# Patient Record
Sex: Female | Born: 1945 | Race: Black or African American | Hispanic: No | Marital: Single | Smoking: Current every day smoker
Health system: Southern US, Community
[De-identification: ages and names within clinical notes are randomized; demographics above are authoritative.]

## PROBLEM LIST (undated history)

## (undated) DIAGNOSIS — IMO0002 Reserved for concepts with insufficient information to code with codable children: Secondary | ICD-10-CM

## (undated) DIAGNOSIS — I10 Essential (primary) hypertension: Secondary | ICD-10-CM

## (undated) DIAGNOSIS — E1165 Type 2 diabetes mellitus with hyperglycemia: Secondary | ICD-10-CM

## (undated) DIAGNOSIS — E785 Hyperlipidemia, unspecified: Secondary | ICD-10-CM

## (undated) HISTORY — PX: TONSILECTOMY, ADENOIDECTOMY, BILATERAL MYRINGOTOMY AND TUBES: SHX2538

## (undated) HISTORY — PX: ABDOMINAL HYSTERECTOMY: SUR658

---

## 2011-11-25 ENCOUNTER — Other Ambulatory Visit: Payer: Self-pay | Admitting: Cardiovascular Disease

## 2011-12-22 ENCOUNTER — Ambulatory Visit (HOSPITAL_COMMUNITY)
Admission: RE | Admit: 2011-12-22 | Discharge: 2011-12-22 | Disposition: A | Discharge status: Home / Self Care | Payer: 59 | Source: Ambulatory Visit | Attending: Cardiovascular Disease | Admitting: Cardiovascular Disease

## 2011-12-22 ENCOUNTER — Encounter (HOSPITAL_COMMUNITY): Payer: Self-pay | Admitting: Pharmacy Technician

## 2011-12-22 ENCOUNTER — Encounter (HOSPITAL_COMMUNITY): Payer: Self-pay | Admitting: Cardiology

## 2011-12-22 ENCOUNTER — Encounter (HOSPITAL_COMMUNITY)
Admission: RE | Disposition: A | Discharge status: Home / Self Care | Payer: Self-pay | Source: Ambulatory Visit | Attending: Cardiovascular Disease

## 2011-12-22 DIAGNOSIS — I2510 Atherosclerotic heart disease of native coronary artery without angina pectoris: Secondary | ICD-10-CM | POA: Insufficient documentation

## 2011-12-22 DIAGNOSIS — I2582 Chronic total occlusion of coronary artery: Secondary | ICD-10-CM | POA: Insufficient documentation

## 2011-12-22 DIAGNOSIS — E1165 Type 2 diabetes mellitus with hyperglycemia: Secondary | ICD-10-CM | POA: Diagnosis present

## 2011-12-22 DIAGNOSIS — E119 Type 2 diabetes mellitus without complications: Secondary | ICD-10-CM | POA: Insufficient documentation

## 2011-12-22 DIAGNOSIS — Z8249 Family history of ischemic heart disease and other diseases of the circulatory system: Secondary | ICD-10-CM | POA: Insufficient documentation

## 2011-12-22 DIAGNOSIS — E785 Hyperlipidemia, unspecified: Secondary | ICD-10-CM | POA: Insufficient documentation

## 2011-12-22 DIAGNOSIS — F17200 Nicotine dependence, unspecified, uncomplicated: Secondary | ICD-10-CM | POA: Insufficient documentation

## 2011-12-22 DIAGNOSIS — I10 Essential (primary) hypertension: Secondary | ICD-10-CM | POA: Insufficient documentation

## 2011-12-22 DIAGNOSIS — R9430 Abnormal result of cardiovascular function study, unspecified: Secondary | ICD-10-CM | POA: Diagnosis present

## 2011-12-22 DIAGNOSIS — R9439 Abnormal result of other cardiovascular function study: Secondary | ICD-10-CM | POA: Insufficient documentation

## 2011-12-22 HISTORY — DX: Reserved for concepts with insufficient information to code with codable children: IMO0002

## 2011-12-22 HISTORY — DX: Hyperlipidemia, unspecified: E78.5

## 2011-12-22 HISTORY — DX: Type 2 diabetes mellitus with hyperglycemia: E11.65

## 2011-12-22 HISTORY — PX: LEFT HEART CATHETERIZATION WITH CORONARY ANGIOGRAM: SHX5451

## 2011-12-22 HISTORY — DX: Essential (primary) hypertension: I10

## 2011-12-22 LAB — CBC
MCH: 28.2 pg (ref 26.0–34.0)
MCHC: 33.9 g/dL (ref 30.0–36.0)
MCV: 83.3 fL (ref 78.0–100.0)
Platelets: 286 10*3/uL (ref 150–400)
RBC: 4.32 MIL/uL (ref 3.87–5.11)

## 2011-12-22 LAB — BASIC METABOLIC PANEL
BUN: 14 mg/dL (ref 6–23)
CO2: 28 mEq/L (ref 19–32)
Calcium: 9.9 mg/dL (ref 8.4–10.5)
GFR calc non Af Amer: 73 mL/min — ABNORMAL LOW (ref >90)
Glucose, Bld: 329 mg/dL — ABNORMAL HIGH (ref 70–99)
Sodium: 138 mEq/L (ref 135–145)

## 2011-12-22 LAB — GLUCOSE, CAPILLARY: Glucose-Capillary: 263 mg/dL — ABNORMAL HIGH (ref 70–99)

## 2011-12-22 SURGERY — LEFT HEART CATHETERIZATION WITH CORONARY ANGIOGRAM
Anesthesia: LOCAL

## 2011-12-22 MED ORDER — NITROGLYCERIN 0.2 MG/ML ON CALL CATH LAB
INTRAVENOUS | Status: AC
  Filled 2011-12-22: qty 1

## 2011-12-22 MED ORDER — HEPARIN (PORCINE) IN NACL 2-0.9 UNIT/ML-% IJ SOLN
INTRAMUSCULAR | Status: AC
  Filled 2011-12-22: qty 1000

## 2011-12-22 MED ORDER — SODIUM CHLORIDE 0.9 % IV SOLN
INTRAVENOUS | Status: DC
  Administered 2011-12-22: 12:00:00 via INTRAVENOUS

## 2011-12-22 MED ORDER — LIDOCAINE HCL (PF) 1 % IJ SOLN
INTRAMUSCULAR | Status: AC
  Filled 2011-12-22: qty 30

## 2011-12-22 MED ORDER — DIAZEPAM 5 MG PO TABS
5.0000 mg | ORAL_TABLET | ORAL | Status: AC
  Administered 2011-12-22: 5 mg via ORAL
  Filled 2011-12-22: qty 1

## 2011-12-22 MED ORDER — SODIUM CHLORIDE 0.9 % IJ SOLN: 3.0000 mL | INTRAMUSCULAR | Status: DC | PRN

## 2011-12-22 MED ORDER — HEPARIN (PORCINE) IN NACL 2-0.9 UNIT/ML-% IJ SOLN
INTRAMUSCULAR | Status: AC
  Filled 2011-12-22: qty 500

## 2011-12-22 NOTE — H&P (Signed)
  H & P will be scanned in.  Pt was reexamined and existing H & P reviewed. No changes found.  [name], MD [facility] 12/22/2011 1:16 PM

## 2011-12-22 NOTE — Op Note (Signed)
Dorothy [facility] is a 66 y.o. female    [id number] LOCATION:  FACILITY: [facility]  PHYSICIAN: Nanetta Batty, M.D. 1945-12-23   DATE OF PROCEDURE:  12/22/2011  DATE OF DISCHARGE:  [facility]  CARDIAC CATHETERIZATION     History obtained from chart review.Pleasant 79 y/o single mother with one son. She has worked at [employer] for 81yrs. No history of chest pain but she had an abnormal Myoview a year ago. She has multiple cardiac risk factors including DM, HTN, smoking, Dyslipidemia, and a family history. Her GXT Myoview was recently repeated and revealed moderate ischemia. She is admitted now for diagnostic cath. Echo in Nov 2013- NL LVF.    PROCEDURE DESCRIPTION:    The patient was brought to the second floor  [facility] lab in the postabsorptive state. She was premedicated with Valium 5 mg by mouth. Her right groinwas prepped and shaved in usual sterile fashion. Xylocaine 1% was used for local anesthesia. A 5 French sheath was inserted into the right common femoral  artery using standard Seldinger technique. 5 French right and left Judkins diagnostic catheters over 5 and pigtail catheter were used for selective coronary angiography and left ventriculography respectively. A subselective view of the left internal mammary artery was also patent. Visipaque dye was used for the entirety of the case. Retrograde aortic, left ventricular, and pullback pressures were recorded.   HEMODYNAMICS:    AO SYSTOLIC/AO DIASTOLIC: 156/73   LV SYSTOLIC/LV DIASTOLIC: 153/9  ANGIOGRAPHIC RESULTS:   1. Left main; normal  2. LAD; moderately calcified fluoroscopically in the proximal segment with minimal irregularities. 3. Left circumflex; nondominant occlusion in the midportion and left to left collaterals of the distal PLA branches.  4. Right coronary artery; dominant with a 60% stenosis on the first bend , 80% stenosis at the genue, total PLA just after the PDA  takeoff. The PLA filled by left-to-right collaterals. 5.LIMA was subselectively visualized and widely patent. It was suitable for use during coronary artery bypass grafting if necessary 6. 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 Her LAD has minor irregularities. She is completely asymptomatic. At this point I think continued medical therapy is warranted. I do not think she needs percutaneous or surgical revascularization. In general of the right common femoral artery demonstrating suitability for closure and a "mixed" femoral closure device was used to obtain hemostasis. The patient left the Cath Lab in stable condition she will remain recumbent for 2 hours, will be gently hydrated and discharged home. She will followup with Arnette Felts PA-C at [facility] clinic who has been notified of these results.  [name] MD, [facility] 12/22/2011 1:54 PM

## 2011-12-22 NOTE — H&P (Signed)
Patient ID: Dorothy Watkins MRN: [id number], DOB/AGE: [birth year]   Admit date: 12/22/2011   Primary Physician: No primary provider on file. Primary Cardiologist: M. Duran PA  HPI:  Pleasant 66 y/o single mother with one son. She has worked at [employer] for 11yrs. No history of chest pain but she had an abnormal Myoview a year ago. She has multiple cardiac risk factors including DM, HTN, smoking, Dyslipidemia, and a family history. Her GXT Myoview was recently repeated and revealed moderate ischemia. She is admitted now for diagnostic cath. Echo in Nov 2013- NL LVF.   Problem List: Past Medical History  Diagnosis Date  . Diabetes mellitus type II, uncontrolled     HGb A1c 25 Oct 2011  . HTN (hypertension)   . Dyslipidemia     Past Surgical History  Procedure Date  . Abdominal hysterectomy   . Tonsilectomy, adenoidectomy, bilateral myringotomy and tubes      Allergies:  Allergies  Allergen Reactions  . Penicillins Swelling     Home Medications Prescriptions prior to admission  Medication Sig Dispense Refill  . aspirin EC 81 MG tablet Take 81 mg by mouth daily.      . Multiple Vitamin (MULTIVITAMIN WITH MINERALS) TABS Take 1 tablet by mouth daily.      Marland Kitchen olmesartan-hydrochlorothiazide (BENICAR HCT) 40-25 MG per tablet Take 1 tablet by mouth daily.      Marland Kitchen PRESCRIPTION MEDICATION Take 500 mg by mouth every morning. Metformin.      Marland Kitchen PRESCRIPTION MEDICATION Take 1 tablet by mouth daily. 3-in-1 combo diabetic medication.      Marland Kitchen PRESCRIPTION MEDICATION Take 1 tablet by mouth daily. Cholesterol combination medication.      . Vitamin D, Ergocalciferol, (DRISDOL) 50000 UNITS CAPS Take 50,000 Units by mouth every 7 (seven) days. Takes on Fridays.         Family History  Problem Relation Age of Onset  . Coronary artery disease Father 84     History   Social History  . Marital Status: Single    Spouse Name: N/A    Number of Children: N/A  . Years of Education: N/A    Occupational History  . Not on file.   Social History Main Topics  . Smoking status: Current Every Day Smoker -- 0.3 packs/day  . Smokeless tobacco: Not on file  . Alcohol Use: Not on file  . Drug Use: Not on file  . Sexually Active: Not on file   Other Topics Concern  . Not on file   Social History Narrative   Single mother, one son with grandchildren. Worked in [employer] at [employer] for 42yrs.     Review of Systems: General: negative for chills, fever, night sweats or weight changes.  Cardiovascular: negative for chest pain, dyspnea on exertion, edema, orthopnea, palpitations, paroxysmal nocturnal dyspnea or shortness of breath Dermatological: negative for rash Respiratory: negative for cough or wheezing Urologic: negative for hematuria, prior history of nephrolithiasis, no history of nephropathy Abdominal: negative for nausea, vomiting, diarrhea, bright red blood per rectum, melena, or hematemesis Neurologic: negative for visual changes, syncope, or dizziness, no history of neuropathy History of osteoporosis, chronic Lt ankle pain after remote injury. All other systems reviewed and are otherwise negative except as noted above.  Physical Exam: Blood pressure 137/74, pulse 69, temperature 98.6 F (37 C), temperature source Oral, resp. rate 18, height 5\' 10"  (1.778 m), weight 85.276 kg (188 lb), SpO2 97.00%.  General appearance: alert, cooperative and no distress Neck: no adenopathy, no  carotid bruit, no JVD, supple, symmetrical, trachea midline and thyroid not enlarged, symmetric, no tenderness/mass/nodules Lungs: clear to auscultation bilaterally Heart: regular rate and rhythm, S1, S2 normal, no murmur, click, rub or gallop Abdomen: soft, non-tender; bowel sounds normal; no masses,  no organomegaly Extremities: extremities normal, atraumatic, no cyanosis or edema Pulses: 2+ and symmetric Skin: Skin color, texture, turgor normal. No rashes or lesions Neurologic: Grossly  normal    Labs:   Results for orders placed during Dorothy hospital encounter of 12/22/11 (from Dorothy past 24 hour(s))  GLUCOSE, CAPILLARY     Status: Abnormal   Collection Time   12/22/11 11:22 AM      Component Value Range   Glucose-Capillary 291 (*) 70 - 99 mg/dL  BASIC METABOLIC PANEL     Status: Abnormal   Collection Time   12/22/11 11:23 AM      Component Value Range   Sodium 138  135 - 145 mEq/L   Potassium 3.7  3.5 - 5.1 mEq/L   Chloride 100  96 - 112 mEq/L   CO2 28  19 - 32 mEq/L   Glucose, Bld 329 (*) 70 - 99 mg/dL   BUN 14  6 - 23 mg/dL   Creatinine, Ser 1.61  0.50 - 1.10 mg/dL   Calcium 9.9  8.4 - 09.6 mg/dL   GFR calc non Af Amer 73 (*) >90 mL/min   GFR calc Af Amer 85 (*) >90 mL/min  CBC     Status: Normal   Collection Time   12/22/11 11:23 AM      Component Value Range   WBC 9.6  4.0 - 10.5 K/uL   RBC 4.32  3.87 - 5.11 MIL/uL   Hemoglobin 12.2  12.0 - 15.0 g/dL   HCT 04.5  40.9 - 81.1 %   MCV 83.3  78.0 - 100.0 fL   MCH 28.2  26.0 - 34.0 pg   MCHC 33.9  30.0 - 36.0 g/dL   RDW 91.4  78.2 - 95.6 %   Platelets 286  150 - 400 K/uL  PROTIME-INR     Status: Normal   Collection Time   12/22/11 11:23 AM      Component Value Range   Prothrombin Time 14.0  11.6 - 15.2 seconds   INR 1.09  0.00 - 1.49     Radiology/Studies: No results found.  OZH:[id number]  ASSESSMENT AND PLAN:  Principal Problem:  *Abnormal cardiovascular function study, 11/13- moderate ischemia Active Problems:  Diabetes mellitus type II, uncontrolled, HGb A1c 14 in October 2013  HTN (hypertension)  Dyslipidemia  Family history of coronary arteriosclerosis, F died in his late 34s  Plan- Cath today.  [name], PA-C 12/22/2011, 12:12 PM  Agree with note written by [name] PAC  + CRF, + myoview for cath today  [name] 12/22/2011 1:16 PM

## 2013-12-20 ENCOUNTER — Encounter (HOSPITAL_COMMUNITY): Payer: Self-pay | Admitting: Cardiovascular Disease

## 2014-12-08 ENCOUNTER — Encounter (HOSPITAL_BASED_OUTPATIENT_CLINIC_OR_DEPARTMENT_OTHER): Payer: Self-pay | Admitting: *Deleted

## 2014-12-08 ENCOUNTER — Emergency Department (HOSPITAL_BASED_OUTPATIENT_CLINIC_OR_DEPARTMENT_OTHER): Payer: Medicare HMO

## 2014-12-08 ENCOUNTER — Emergency Department (HOSPITAL_BASED_OUTPATIENT_CLINIC_OR_DEPARTMENT_OTHER)
Admission: EM | Admit: 2014-12-08 | Discharge: 2014-12-09 | Disposition: A | Discharge status: Other Acute Inpatient Hospital | Payer: Medicare HMO | Attending: Emergency Medicine | Admitting: Emergency Medicine

## 2014-12-08 DIAGNOSIS — F17200 Nicotine dependence, unspecified, uncomplicated: Secondary | ICD-10-CM | POA: Insufficient documentation

## 2014-12-08 DIAGNOSIS — Z79899 Other long term (current) drug therapy: Secondary | ICD-10-CM | POA: Diagnosis not present

## 2014-12-08 DIAGNOSIS — Z9889 Other specified postprocedural states: Secondary | ICD-10-CM | POA: Diagnosis not present

## 2014-12-08 DIAGNOSIS — E11621 Type 2 diabetes mellitus with foot ulcer: Secondary | ICD-10-CM | POA: Diagnosis not present

## 2014-12-08 DIAGNOSIS — Z7984 Long term (current) use of oral hypoglycemic drugs: Secondary | ICD-10-CM | POA: Insufficient documentation

## 2014-12-08 DIAGNOSIS — E785 Hyperlipidemia, unspecified: Secondary | ICD-10-CM | POA: Diagnosis not present

## 2014-12-08 DIAGNOSIS — L03116 Cellulitis of left lower limb: Secondary | ICD-10-CM

## 2014-12-08 DIAGNOSIS — I10 Essential (primary) hypertension: Secondary | ICD-10-CM | POA: Insufficient documentation

## 2014-12-08 DIAGNOSIS — L97529 Non-pressure chronic ulcer of other part of left foot with unspecified severity: Secondary | ICD-10-CM | POA: Insufficient documentation

## 2014-12-08 DIAGNOSIS — M79675 Pain in left toe(s): Secondary | ICD-10-CM | POA: Diagnosis present

## 2014-12-08 DIAGNOSIS — Z880 Allergy status to penicillin: Secondary | ICD-10-CM | POA: Insufficient documentation

## 2014-12-08 LAB — CBC WITH DIFFERENTIAL/PLATELET
Basophils Absolute: 0.1 10*3/uL (ref 0.0–0.1)
Basophils Relative: 0 %
Eosinophils Absolute: 0.1 10*3/uL (ref 0.0–0.7)
Eosinophils Relative: 0 %
HEMATOCRIT: 33.7 % — AB (ref 36.0–46.0)
HEMOGLOBIN: 10.9 g/dL — AB (ref 12.0–15.0)
LYMPHS ABS: 0.9 10*3/uL (ref 0.7–4.0)
Lymphocytes Relative: 4 %
MCH: 26.9 pg (ref 26.0–34.0)
MCHC: 32.3 g/dL (ref 30.0–36.0)
MCV: 83.2 fL (ref 78.0–100.0)
MONOS PCT: 7 %
Monocytes Absolute: 1.4 10*3/uL — ABNORMAL HIGH (ref 0.1–1.0)
NEUTROS ABS: 18.3 10*3/uL — AB (ref 1.7–7.7)
NEUTROS PCT: 89 %
Platelets: 394 10*3/uL (ref 150–400)
RBC: 4.05 MIL/uL (ref 3.87–5.11)
RDW: 11.8 % (ref 11.5–15.5)
WBC: 20.7 10*3/uL — ABNORMAL HIGH (ref 4.0–10.5)

## 2014-12-08 LAB — BASIC METABOLIC PANEL
Anion gap: 9 (ref 5–15)
BUN: 14 mg/dL (ref 6–20)
CHLORIDE: 95 mmol/L — AB (ref 101–111)
CO2: 27 mmol/L (ref 22–32)
CREATININE: 1.21 mg/dL — AB (ref 0.44–1.00)
Calcium: 8 mg/dL — ABNORMAL LOW (ref 8.9–10.3)
GFR calc non Af Amer: 45 mL/min — ABNORMAL LOW (ref >60)
GFR, EST AFRICAN AMERICAN: 52 mL/min — AB (ref >60)
Glucose, Bld: 336 mg/dL — ABNORMAL HIGH (ref 65–99)
POTASSIUM: 3.1 mmol/L — AB (ref 3.5–5.1)
Sodium: 131 mmol/L — ABNORMAL LOW (ref 135–145)

## 2014-12-08 LAB — I-STAT CG4 LACTIC ACID, ED: Lactic Acid, Venous: 1.1 mmol/L (ref 0.5–2.0)

## 2014-12-08 MED ORDER — VANCOMYCIN HCL IN DEXTROSE 1-5 GM/200ML-% IV SOLN
1000.0000 mg | Freq: Once | INTRAVENOUS | Status: AC
  Administered 2014-12-09: 1000 mg via INTRAVENOUS
  Filled 2014-12-08: qty 200

## 2014-12-08 MED ORDER — DEXTROSE 5 % IV SOLN
1.0000 g | Freq: Once | INTRAVENOUS | Status: AC
  Administered 2014-12-09: 1 g via INTRAVENOUS

## 2014-12-08 MED ORDER — ACETAMINOPHEN 325 MG PO TABS
650.0000 mg | ORAL_TABLET | Freq: Once | ORAL | Status: AC
  Administered 2014-12-08: 650 mg via ORAL
  Filled 2014-12-08: qty 2

## 2014-12-08 NOTE — ED Provider Notes (Signed)
CSN: [id number]     Arrival date & time 12/08/14  2208 History  By signing my name below, I, Dorothy Watkins, attest that this documentation has been prepared under the direction and in the presence of Paula Libra, MD. Electronically Signed: Jarvis Watkins, ED Scribe. 12/09/2014. 1:36 AM.    Chief Complaint  Patient presents with  . Toe Pain   The history is provided by the patient. No language interpreter was used.    HPI Comments: [facility] [employer] is a 69 y.o. female with a h/o DM type II who presents to the Emergency Department complaining of gradual onset, constant, severe, 10/10, left great toe pain onset 1 month that has begun to gradually worsen 2 weeks ago. She states 1 month ago she stubbed the toe and then 2 weeks ago the toenail fell off and the toe begun to subsequently become swollen, red and has begun to radiate up into her mid shin. Pt was unaware of fever at home but endorses associated fever of 101F upon arrival to ED and chills. She denies any alleviating/aggravating factors. Pt notes h/o uncontrolled diabetes. She denies any other associated symptoms at this time.   PCP: Dr. Arnette Felts, [facility]   Past Medical History  Diagnosis Date  . Diabetes mellitus type II, uncontrolled (HCC)     HGb A1c 25 Oct 2011  . HTN (hypertension)   . Dyslipidemia    Past Surgical History  Procedure Laterality Date  . Abdominal hysterectomy    . Tonsilectomy, adenoidectomy, bilateral myringotomy and tubes    . Left heart catheterization with coronary angiogram N/A 12/22/2011    Procedure: LEFT HEART CATHETERIZATION WITH CORONARY ANGIOGRAM;  Surgeon: [name], MD;  Location: [facility] CATH LAB;  Service: Cardiovascular;  Laterality: N/A;   Family History  Problem Relation Age of Onset  . Coronary artery disease Father 72   Social History  Substance Use Topics  . Smoking status: Current Every Day Smoker -- 0.30 packs/day  . Smokeless tobacco: None  . Alcohol Use: None   OB  History    No data available     Review of Systems A complete 10 system review of systems was obtained and all systems are negative except as noted in the HPI and PMH.     Allergies  Penicillins  Home Medications   Prior to Admission medications   Medication Sig Start Date End Date Taking? Authorizing Provider  aspirin EC 81 MG tablet Take 81 mg by mouth daily.    Historical Provider, MD  metFORMIN (GLUCOPHAGE) 500 MG tablet Take 500 mg by mouth daily.    Historical Provider, MD  Multiple Vitamin (MULTIVITAMIN WITH MINERALS) TABS Take 1 tablet by mouth daily.    Historical Provider, MD  niacin (NIASPAN) 500 MG CR tablet Take 500 mg by mouth daily. Takes 30 minutes after taking daily aspirin.    Historical Provider, MD  nitroGLYCERIN (NITROSTAT) 0.4 MG SL tablet Place 0.4 mg under the tongue every 5 (five) minutes as needed. For chest pain.    Historical Provider, MD  olmesartan-hydrochlorothiazide (BENICAR HCT) 40-25 MG per tablet Take 1 tablet by mouth daily.    Historical Provider, MD  Saxagliptin-Metformin (KOMBIGLYZE XR) 5-500 MG TB24 Take 1 tablet by mouth daily.    Historical Provider, MD  Vitamin D, Ergocalciferol, (DRISDOL) 50000 UNITS CAPS Take 50,000 Units by mouth every 7 (seven) days. Takes on Fridays.    Historical Provider, MD   Triage Vitals: BP 183/91 mmHg  Pulse 104  Temp(Src) 101 F (38.3 C) (Oral)  Resp 18  Ht 5\' 9"  (1.753 m)  Wt 191 lb (86.637 kg)  BMI 28.19 kg/m2  SpO2 98%   Physical Exam  Nursing note and vitals reviewed.  General: Well-developed, well-nourished female in no acute distress; appearance consistent with age of record HENT: normocephalic; atraumatic Eyes: pupils equal, round and reactive to light; extraocular muscles intact Neck: supple Heart: regular rate and rhythm; no murmurs, rubs or gallops Lungs: clear to auscultation bilaterally Abdomen: soft; nondistended; nontender; no masses or hepatosplenomegaly; bowel sounds  present Extremities: Erythema and warmth from mid left lower leg down; erythema, swelling and tenderness of left great toe with partial avulsion of nail and ulcerated wound to lateral aspect of base of toe:      Neurologic: Awake, alert and oriented; motor function intact in all extremities and symmetric; no facial droop Skin: Warm and dry Psychiatric: Normal mood and affect   ED Course  Procedures (including critical care time)   MDM   Nursing notes and vitals signs, including pulse oximetry, reviewed.  Summary of this visit's results, reviewed by myself:  Labs:  Results for orders placed or performed during the hospital encounter of 12/08/14 (from the past 24 hour(s))  CBC with Differential     Status: Abnormal   Collection Time: 12/08/14 10:35 PM  Result Value Ref Range   WBC 20.7 (H) 4.0 - 10.5 K/uL   RBC 4.05 3.87 - 5.11 MIL/uL   Hemoglobin 10.9 (L) 12.0 - 15.0 g/dL   HCT 81.133.7 (L) 91.436.0 - 78.246.0 %   MCV 83.2 78.0 - 100.0 fL   MCH 26.9 26.0 - 34.0 pg   MCHC 32.3 30.0 - 36.0 g/dL   RDW 95.611.8 21.311.5 - 08.615.5 %   Platelets 394 150 - 400 K/uL   Neutrophils Relative % 89 %   Neutro Abs 18.3 (H) 1.7 - 7.7 K/uL   Lymphocytes Relative 4 %   Lymphs Abs 0.9 0.7 - 4.0 K/uL   Monocytes Relative 7 %   Monocytes Absolute 1.4 (H) 0.1 - 1.0 K/uL   Eosinophils Relative 0 %   Eosinophils Absolute 0.1 0.0 - 0.7 K/uL   Basophils Relative 0 %   Basophils Absolute 0.1 0.0 - 0.1 K/uL  Basic metabolic panel     Status: Abnormal   Collection Time: 12/08/14 10:35 PM  Result Value Ref Range   Sodium 131 (L) 135 - 145 mmol/L   Potassium 3.1 (L) 3.5 - 5.1 mmol/L   Chloride 95 (L) 101 - 111 mmol/L   CO2 27 22 - 32 mmol/L   Glucose, Bld 336 (H) 65 - 99 mg/dL   BUN 14 6 - 20 mg/dL   Creatinine, Ser 5.781.21 (H) 0.44 - 1.00 mg/dL   Calcium 8.0 (L) 8.9 - 10.3 mg/dL   GFR calc non Af Amer 45 (L) >60 mL/min   GFR calc Af Amer 52 (L) >60 mL/min   Anion gap 9 5 - 15  I-Stat CG4 Lactic Acid, ED      Status: None   Collection Time: 12/08/14 10:43 PM  Result Value Ref Range   Lactic Acid, Venous 1.10 0.5 - 2.0 mmol/L    Imaging Studies: Dg Ankle Complete Left  12/08/2014  CLINICAL DATA:  LEFT great toe pain radiating into shin you. Redness, bruising and swelling, stubbed toe 1 month ago. EXAM: LEFT FOOT - COMPLETE 3+ VIEW; LEFT ANKLE COMPLETE - 3+ VIEW COMPARISON:  None. FINDINGS: There is no evidence of fracture  or dislocation. Moderate plantar calcaneal spur. There is no evidence of arthropathy or other focal bone abnormality. Type 2 navicular bone. Ankle and foot soft tissue swelling without subcutaneous gas or radiopaque foreign bodies. IMPRESSION: Soft tissue swelling without acute fracture deformity or dislocation. Electronically Signed   By: Awilda Metro M.D.   On: 12/08/2014 23:18   US Venous Img Lower Unilateral Left  12/08/2014  CLINICAL DATA:  Left toes soreness and ulceration for 2 weeks, with streaking erythema in the lower back. Initial encounter. EXAM: LEFT LOWER EXTREMITY VENOUS DOPPLER ULTRASOUND TECHNIQUE: Gray-scale sonography with graded compression, as well as color Doppler and duplex ultrasound were performed to evaluate the lower extremity deep venous systems from the level of the common femoral vein and including the common femoral, femoral, profunda femoral, popliteal and calf veins including the posterior tibial, peroneal and gastrocnemius veins when visible. The superficial great saphenous vein was also interrogated. Spectral Doppler was utilized to evaluate flow at rest and with distal augmentation maneuvers in the common femoral, femoral and popliteal veins. COMPARISON:  None. FINDINGS: Contralateral Common Femoral Vein: Respiratory phasicity is normal and symmetric with the symptomatic side. No evidence of thrombus. Normal compressibility. Common Femoral Vein: No evidence of thrombus. Normal compressibility, respiratory phasicity and response to augmentation.  Saphenofemoral Junction: No evidence of thrombus. Normal compressibility and flow on color Doppler imaging. Profunda Femoral Vein: No evidence of thrombus. Normal compressibility and flow on color Doppler imaging. Femoral Vein: No evidence of thrombus. Normal compressibility, respiratory phasicity and response to augmentation. Popliteal Vein: No evidence of thrombus. Normal compressibility, respiratory phasicity and response to augmentation. Calf Veins: No evidence of thrombus. Normal compressibility and flow on color Doppler imaging. The peroneal vein is not visualized on this study. Superficial Great Saphenous Vein: No evidence of thrombus. Normal compressibility and flow on color Doppler imaging. Venous Reflux:  None. Other Findings: A large 3.4 cm node is noted at the left inguinal region. This demonstrates a normal fatty hilum, but is abnormally enlarged. IMPRESSION: 1. No evidence of deep venous thrombosis. 2. Large 3.4 cm node at the left inguinal region. This would be amenable to biopsy, as deemed clinically appropriate. Electronically Signed   By: Roanna Raider M.D.   On: 12/08/2014 23:43   Dg Foot Complete Left  12/08/2014  CLINICAL DATA:  LEFT great toe pain radiating into shin you. Redness, bruising and swelling, stubbed toe 1 month ago. EXAM: LEFT FOOT - COMPLETE 3+ VIEW; LEFT ANKLE COMPLETE - 3+ VIEW COMPARISON:  None. FINDINGS: There is no evidence of fracture or dislocation. Moderate plantar calcaneal spur. There is no evidence of arthropathy or other focal bone abnormality. Type 2 navicular bone. Ankle and foot soft tissue swelling without subcutaneous gas or radiopaque foreign bodies. IMPRESSION: Soft tissue swelling without acute fracture deformity or dislocation. Electronically Signed   By: Awilda Metro M.D.   On: 12/08/2014 23:18   12:45 AM Blood and wound cultures obtained. Patient started on vancomycin and Rocephin. We'll have her admitted  1:36 AM Dr. Pilar Grammes accepts for  transfer to [facility]  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 The recorded information has been reviewed and is accurate.     Paula Libra, MD 12/09/14 [phone]

## 2014-12-08 NOTE — ED Notes (Addendum)
C/o L great toe pain, radiates up to mid shin, redness, swelling, bruising noted. H/o DM. Reports "there was a sore and I doctored on it and it got worse".  Unsure of fever, but felt cold. Mentions stubbed toe ~ 1 month ago, has not sought care for injury. Family reports she has been focused on caring for others.

## 2014-12-08 NOTE — ED Notes (Signed)
Pt to xray by w/c, family present, NSL in place, blood sent to lab.

## 2014-12-09 DIAGNOSIS — Z9889 Other specified postprocedural states: Secondary | ICD-10-CM | POA: Diagnosis not present

## 2014-12-09 DIAGNOSIS — M79675 Pain in left toe(s): Secondary | ICD-10-CM | POA: Diagnosis present

## 2014-12-09 DIAGNOSIS — F17200 Nicotine dependence, unspecified, uncomplicated: Secondary | ICD-10-CM | POA: Diagnosis not present

## 2014-12-09 DIAGNOSIS — Z79899 Other long term (current) drug therapy: Secondary | ICD-10-CM | POA: Diagnosis not present

## 2014-12-09 DIAGNOSIS — E785 Hyperlipidemia, unspecified: Secondary | ICD-10-CM | POA: Diagnosis not present

## 2014-12-09 DIAGNOSIS — E11621 Type 2 diabetes mellitus with foot ulcer: Secondary | ICD-10-CM | POA: Diagnosis not present

## 2014-12-09 DIAGNOSIS — Z7984 Long term (current) use of oral hypoglycemic drugs: Secondary | ICD-10-CM | POA: Diagnosis not present

## 2014-12-09 DIAGNOSIS — L97529 Non-pressure chronic ulcer of other part of left foot with unspecified severity: Secondary | ICD-10-CM | POA: Diagnosis not present

## 2014-12-09 DIAGNOSIS — I10 Essential (primary) hypertension: Secondary | ICD-10-CM | POA: Diagnosis not present

## 2014-12-09 DIAGNOSIS — L03116 Cellulitis of left lower limb: Secondary | ICD-10-CM | POA: Diagnosis not present

## 2014-12-09 DIAGNOSIS — Z880 Allergy status to penicillin: Secondary | ICD-10-CM | POA: Diagnosis not present

## 2014-12-09 LAB — CBG MONITORING, ED: GLUCOSE-CAPILLARY: 297 mg/dL — AB (ref 65–99)

## 2014-12-09 MED ORDER — CEFTRIAXONE SODIUM 1 G IJ SOLR
INTRAMUSCULAR | Status: AC
  Filled 2014-12-09: qty 10

## 2014-12-09 MED ORDER — FENTANYL CITRATE (PF) 100 MCG/2ML IJ SOLN
50.0000 ug | Freq: Once | INTRAMUSCULAR | Status: AC
  Administered 2014-12-09: 50 ug via INTRAVENOUS
  Filled 2014-12-09: qty 2

## 2014-12-09 MED ORDER — INSULIN REGULAR HUMAN 100 UNIT/ML IJ SOLN
10.0000 [IU] | Freq: Once | INTRAMUSCULAR | Status: AC
  Administered 2014-12-09: 10 [IU] via SUBCUTANEOUS
  Filled 2014-12-09: qty 1

## 2014-12-09 NOTE — ED Notes (Signed)
Dr. Read DriversMolpus into see and update pt.

## 2014-12-09 NOTE — ED Notes (Signed)
[facility] here for tranpsort

## 2014-12-11 LAB — WOUND CULTURE: Gram Stain: NONE SEEN

## 2014-12-12 ENCOUNTER — Telehealth (HOSPITAL_BASED_OUTPATIENT_CLINIC_OR_DEPARTMENT_OTHER): Payer: Self-pay | Admitting: Emergency Medicine

## 2014-12-12 NOTE — Telephone Encounter (Signed)
Post ED Visit - Positive Culture Follow-up  Culture report reviewed by antimicrobial stewardship pharmacist:  []  Enzo BiNathan Batchelder, Pharm.D. []  Celedonio MiyamotoJeremy Frens, Pharm.D., BCPS [x]  Garvin FilaMike Maccia, Pharm.D. []  Georgina PillionElizabeth Martin, Pharm.D., BCPS []  [place] City[place]h Pham, VermontPharm.D., BCPS, AAHIVP []  Estella HuskMichelle Turner, Pharm.D., BCPS, AAHIVP []  Tennis Mustassie Stewart, Pharm.D. []  Sherle Poeob Vincent, [address]Pharm.D[address]  Positive wound culture Group B strep  Treated with none, [facility].Hyacinth Meeker.  Jayanna Kroeger, Larita FifeLynn 12/12/2014, 10:00 AM

## 2014-12-14 LAB — CULTURE, BLOOD (ROUTINE X 2)
CULTURE: NO GROWTH
Culture: NO GROWTH

## 2016-05-01 IMAGING — CR DG ANKLE COMPLETE 3+V*L*
3 series · 3 of 3 positions shown · non-contrast
Comparison: None.

CLINICAL DATA: LEFT great toe pain radiating into shin you.
Redness, bruising and swelling, stubbed toe 1 month ago.

EXAM:
LEFT FOOT - COMPLETE 3+ VIEW; LEFT ANKLE COMPLETE - 3+ VIEW

[t ankle joint ap left]
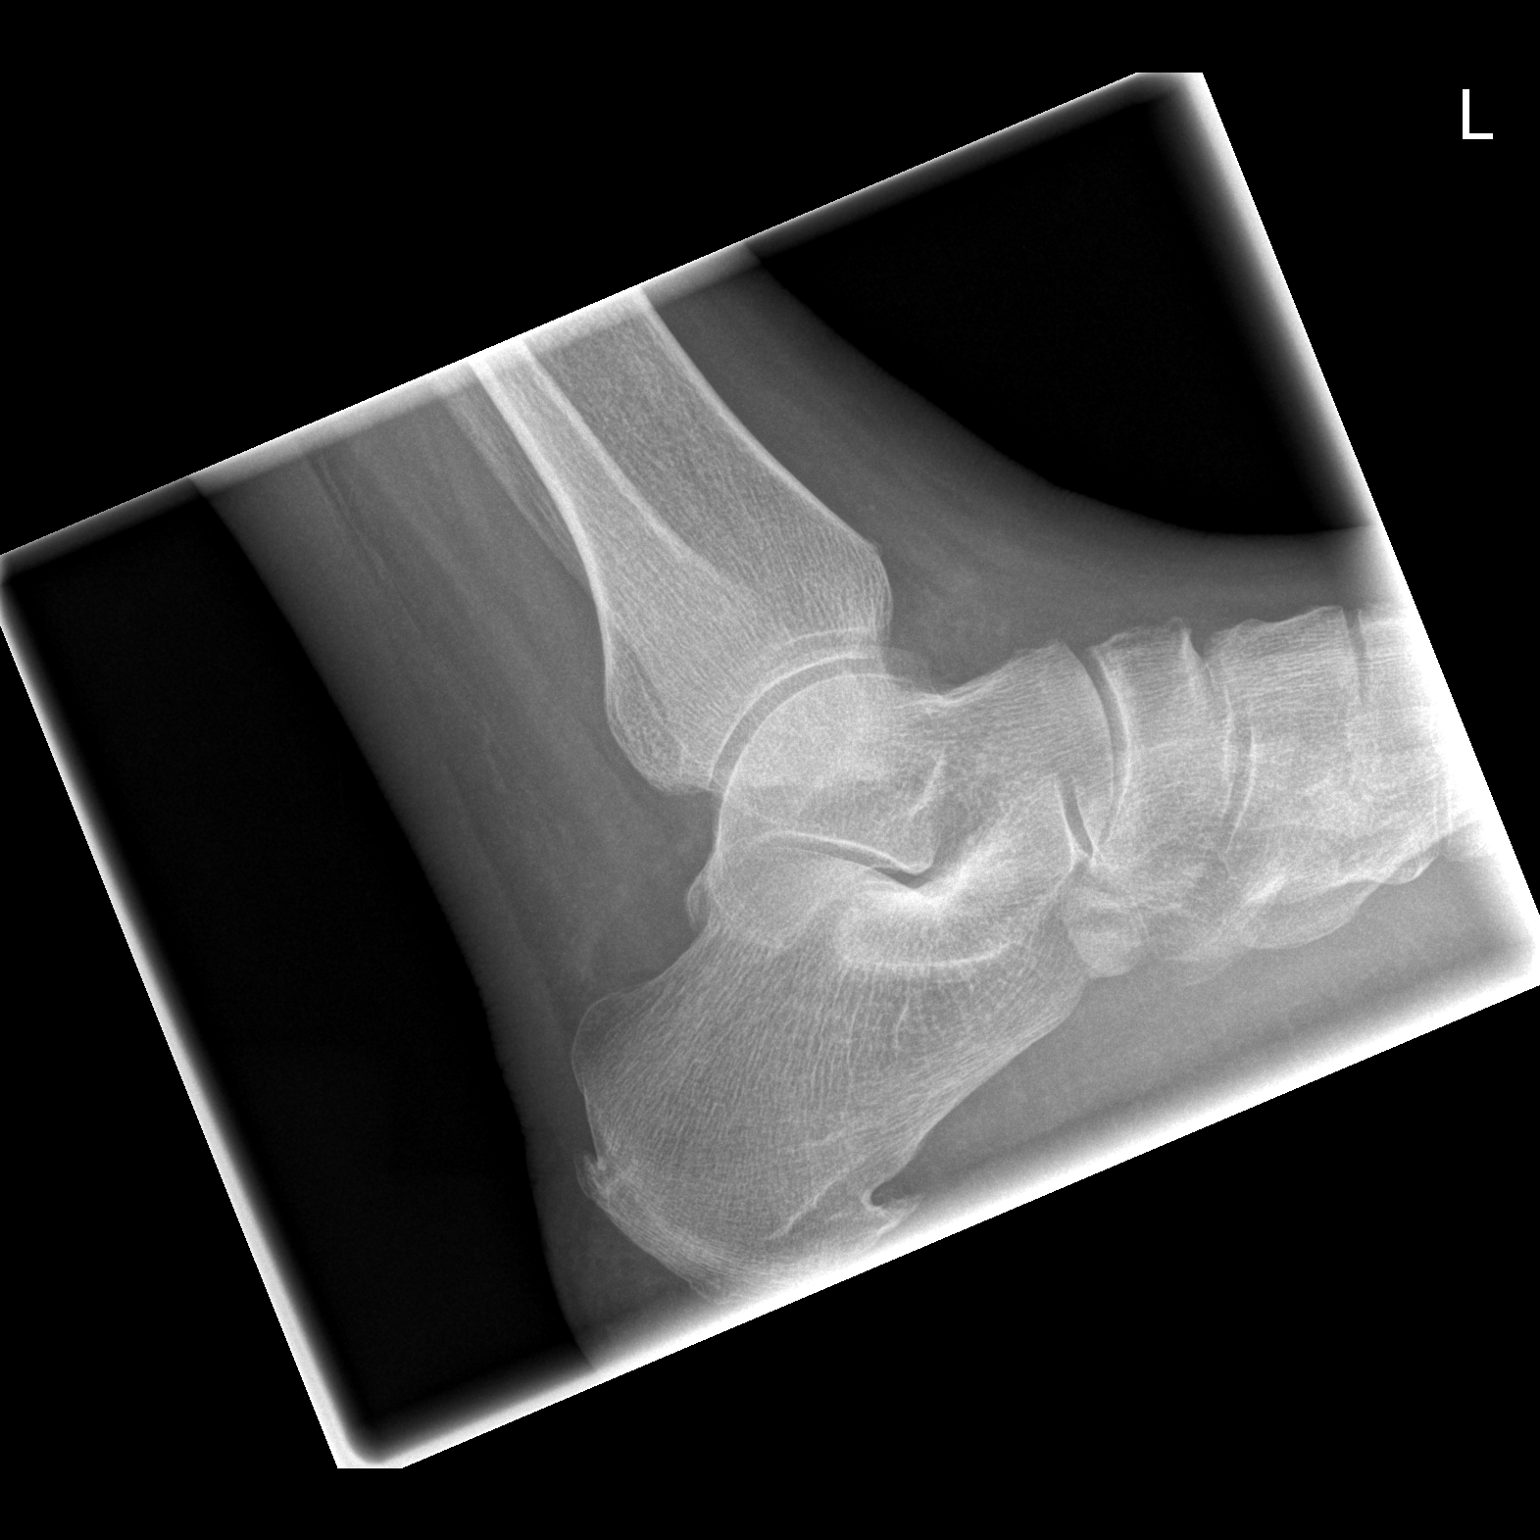

[t ankle joint oblique left]
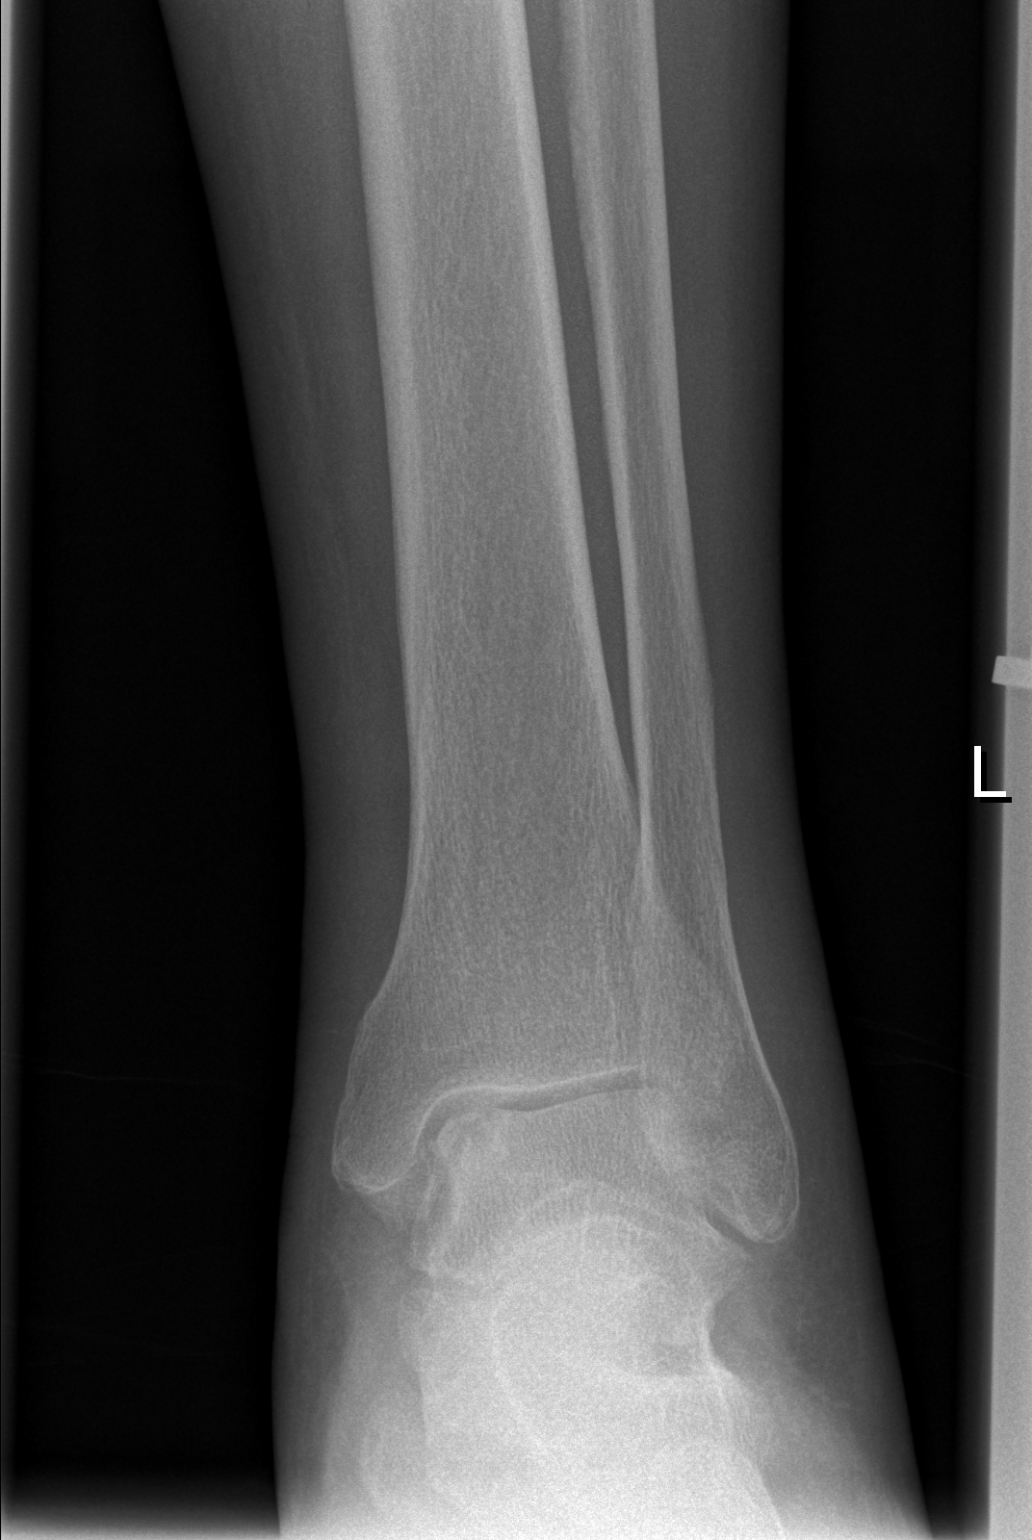

[t ankle joint lat left]
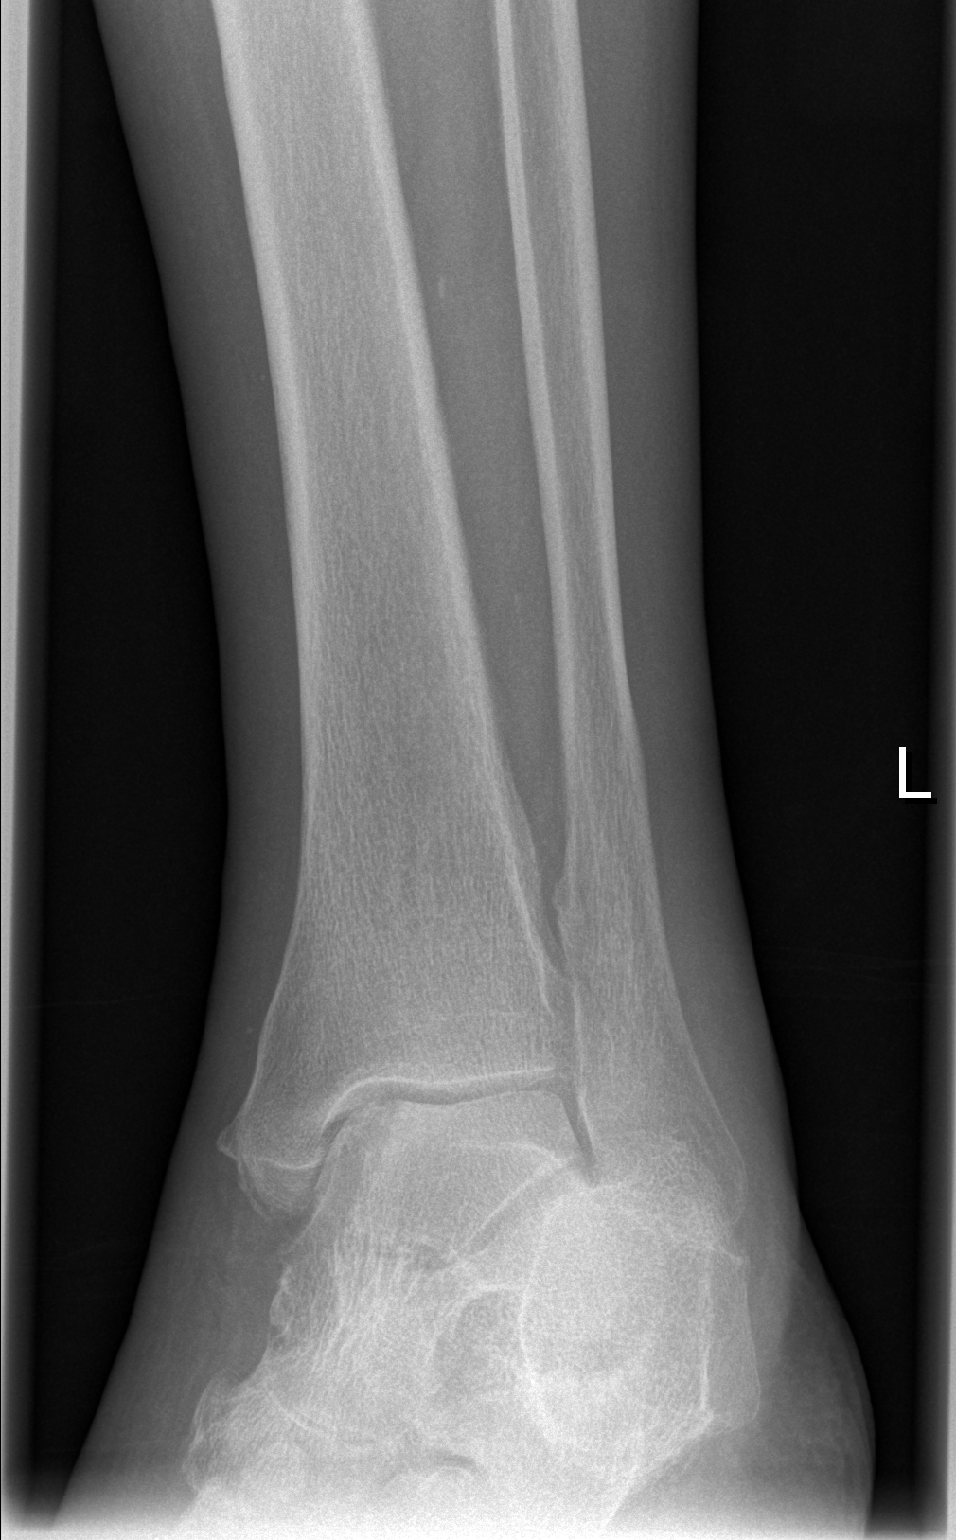

[3 of 3 positions shown; findings below may reference images not displayed]

FINDINGS: There is no evidence of fracture or dislocation. Moderate plantar
calcaneal spur. There is no evidence of arthropathy or other focal
bone abnormality. Type 2 navicular bone. Ankle and foot soft tissue
swelling without subcutaneous gas or radiopaque foreign bodies.
IMPRESSION: Soft tissue swelling without acute fracture deformity or
dislocation.

## 2019-07-02 ENCOUNTER — Telehealth: Payer: Self-pay | Admitting: Cardiovascular Disease

## 2019-07-02 NOTE — Telephone Encounter (Signed)
Kara Mead from [facility] calling stating they received a fax of a cath report for the patient, but it is not legible.

## 2019-07-02 NOTE — Telephone Encounter (Signed)
Routed to medical records pool to assist with records that were faxed
